# Patient Record
Sex: Male | Born: 1990 | Race: Black or African American | Hispanic: No | Marital: Single | Smoking: Current some day smoker
Health system: Southern US, Community
[De-identification: ages and names within clinical notes are randomized; demographics above are authoritative.]

---

## 2017-08-21 ENCOUNTER — Emergency Department (HOSPITAL_BASED_OUTPATIENT_CLINIC_OR_DEPARTMENT_OTHER)
Admission: EM | Admit: 2017-08-21 | Discharge: 2017-08-21 | Disposition: A | Discharge status: Home / Self Care | Payer: Self-pay | Attending: Emergency Medicine | Admitting: Emergency Medicine

## 2017-08-21 ENCOUNTER — Other Ambulatory Visit: Payer: Self-pay

## 2017-08-21 ENCOUNTER — Emergency Department (HOSPITAL_BASED_OUTPATIENT_CLINIC_OR_DEPARTMENT_OTHER): Payer: Self-pay

## 2017-08-21 ENCOUNTER — Encounter (HOSPITAL_BASED_OUTPATIENT_CLINIC_OR_DEPARTMENT_OTHER): Payer: Self-pay

## 2017-08-21 DIAGNOSIS — F17200 Nicotine dependence, unspecified, uncomplicated: Secondary | ICD-10-CM | POA: Insufficient documentation

## 2017-08-21 DIAGNOSIS — M25572 Pain in left ankle and joints of left foot: Secondary | ICD-10-CM | POA: Insufficient documentation

## 2017-08-21 NOTE — ED Triage Notes (Signed)
C/o twisted left ankle Saturday-NAD-walking with crutches-ace wrap/compression to ankle

## 2017-08-21 NOTE — ED Notes (Signed)
Pt brought own crutches and states understanding of how to use

## 2017-08-21 NOTE — Discharge Instructions (Signed)

## 2017-08-21 NOTE — ED Notes (Signed)
Xray called to report pt also c/o pain to left foot-xray added

## 2017-08-21 NOTE — ED Notes (Signed)
ED Provider at bedside. 

## 2017-08-21 NOTE — ED Provider Notes (Signed)
[facility] EMERGENCY DEPARTMENT Provider Note   CSN: [id number] Arrival date & time: 08/21/17  2036     History   Chief Complaint Chief Complaint  Patient presents with  . Ankle Injury    HPI Artyom Stencel is a 27 y.o. male who presents today for evaluation of left-sided ankle and foot pain.  He reports that on Saturday he was walking when he rolled his ankle.  Since then he has been trying an Ace wrap and crutches and reports that it still hurts to bear weight.  He denies any numbness or tingling.  No weakness.  No other injuries.  HPI  History reviewed. No pertinent past medical history.  There are no active problems to display for this patient.   History reviewed. No pertinent surgical history.      Home Medications    Prior to Admission medications   Not on File    Family History No family history on file.  Social History Social History   Tobacco Use  . Smoking status: Current Every Day Smoker  . Smokeless tobacco: Never Used  Substance Use Topics  . Alcohol use: Not Currently  . Drug use: Yes    Types: Marijuana     Allergies   Patient has no known allergies.   Review of Systems Review of Systems  Constitutional: Negative for chills and fever.  Musculoskeletal:       Swelling and pain in left ankle.  Skin: Positive for color change (Brusing over left foot). Negative for wound.  All other systems reviewed and are negative.    Physical Exam Updated Vital Signs BP 129/89 (BP Location: Right Arm)   Pulse 81   Temp 99.4 F (37.4 C) (Oral)   Resp 16   Ht 5\' 9"  (1.753 m)   Wt 83.9 kg (185 lb)   SpO2 100%   BMI 27.32 kg/m   Physical Exam  Constitutional: He appears well-developed and well-nourished.  HENT:  Head: Normocephalic.  Cardiovascular: Intact distal pulses.  Left sided 2+ DP/PT pulses  Musculoskeletal:  There is generalized pain and tenderness to palpation in the left ankle and proximal foot, it is worse over the  lateral aspect of the ankle.  There is swelling on the lateral aspect of the ankle.  There is no proximal left lower leg tenderness to palpation.  Compartments and left leg are soft and easily compressible.  Neurological:  Sensation intact to left foot  Skin: He is not diaphoretic.  Slight ecchymosis with out wounds over left foot dorsum.   Nursing note and vitals reviewed.    ED Treatments / Results  Labs (all labs ordered are listed, but only abnormal results are displayed) Labs Reviewed - No data to display  EKG None  Radiology Dg Ankle Complete Left  Result Date: 08/21/2017 CLINICAL DATA:  27 year old male with twisting of the left ankle. EXAM: LEFT ANKLE COMPLETE - 3+ VIEW; LEFT FOOT - COMPLETE 3+ VIEW COMPARISON:  None. FINDINGS: There is no acute fracture or dislocation. The bones are well mineralized. No arthritic changes. The ankle mortise is intact. There is mild soft tissue swelling of the ankle. IMPRESSION: No acute fracture or dislocation. Soft tissue swelling of the ankle. Electronically Signed   By: Elgie Collard M.D.   On: 08/21/2017 21:37   Dg Foot Complete Left  Result Date: 08/21/2017 CLINICAL DATA:  28 year old male with twisting of the left ankle. EXAM: LEFT ANKLE COMPLETE - 3+ VIEW; LEFT FOOT - COMPLETE 3+ VIEW  COMPARISON:  None. FINDINGS: There is no acute fracture or dislocation. The bones are well mineralized. No arthritic changes. The ankle mortise is intact. There is mild soft tissue swelling of the ankle. IMPRESSION: No acute fracture or dislocation. Soft tissue swelling of the ankle. Electronically Signed   By: Elgie CollardArash  Radparvar M.D.   On: 08/21/2017 21:37    Procedures Procedures (including critical care time)  Medications Ordered in ED Medications - No data to display   Initial Impression / Assessment and Plan / ED Course  I have reviewed the triage vital signs and the nursing notes.  Pertinent labs & imaging results that were available during my  care of the patient were reviewed by me and considered in my medical decision making (see chart for details).     Seabron SpatesKenard Erxleben Presents with left ankle pain after rolling his ankle consistent with an ankle sprain/strain.  The affected ankle has mild edema and is tender on the lateral aspect.  X-rays were obtained with out acute abnormality. The skin is intact to ankle/foot.  The foot is warm and well perfused with intact sensation.  Motor function is limited secondary to pain.  Patient given instructions for OTC pain medication, and ASO as he has crutches and Ace wrap already.  Patient advised to follow up with PCP if symptoms persist for longer than one week.  Patient was given the option to ask questions, all of which were answered to the best of my ability.  Patient is agreeable for discharge.     Final Clinical Impressions(s) / ED Diagnoses   Final diagnoses:  Acute left ankle pain    ED Discharge Orders    None       Norman ClayHammond, Elizabeth W, PA-C 08/21/17 2329    Arby BarrettePfeiffer, Marcy, MD 08/31/17 2241

## 2019-08-25 ENCOUNTER — Emergency Department (HOSPITAL_BASED_OUTPATIENT_CLINIC_OR_DEPARTMENT_OTHER): Payer: Self-pay

## 2019-08-25 ENCOUNTER — Emergency Department (HOSPITAL_BASED_OUTPATIENT_CLINIC_OR_DEPARTMENT_OTHER)
Admission: EM | Admit: 2019-08-25 | Discharge: 2019-08-25 | Disposition: A | Discharge status: Home / Self Care | Payer: Self-pay | Attending: Emergency Medicine | Admitting: Emergency Medicine

## 2019-08-25 ENCOUNTER — Other Ambulatory Visit: Payer: Self-pay

## 2019-08-25 ENCOUNTER — Encounter (HOSPITAL_BASED_OUTPATIENT_CLINIC_OR_DEPARTMENT_OTHER): Payer: Self-pay | Admitting: Emergency Medicine

## 2019-08-25 DIAGNOSIS — F17210 Nicotine dependence, cigarettes, uncomplicated: Secondary | ICD-10-CM | POA: Insufficient documentation

## 2019-08-25 DIAGNOSIS — R0789 Other chest pain: Secondary | ICD-10-CM | POA: Insufficient documentation

## 2019-08-25 LAB — CBC
HCT: 42.5 % (ref 39.0–52.0)
Hemoglobin: 13.3 g/dL (ref 13.0–17.0)
MCH: 26.2 pg (ref 26.0–34.0)
MCHC: 31.3 g/dL (ref 30.0–36.0)
MCV: 83.8 fL (ref 80.0–100.0)
Platelets: 269 10*3/uL (ref 150–400)
RBC: 5.07 MIL/uL (ref 4.22–5.81)
RDW: 14 % (ref 11.5–15.5)
WBC: 7.2 10*3/uL (ref 4.0–10.5)
nRBC: 0 % (ref 0.0–0.2)

## 2019-08-25 LAB — BASIC METABOLIC PANEL
Anion gap: 11 (ref 5–15)
BUN: 11 mg/dL (ref 6–20)
CO2: 24 mmol/L (ref 22–32)
Calcium: 9.4 mg/dL (ref 8.9–10.3)
Chloride: 104 mmol/L (ref 98–111)
Creatinine, Ser: 0.94 mg/dL (ref 0.61–1.24)
GFR calc Af Amer: >60 mL/min (ref >60)
GFR calc non Af Amer: >60 mL/min (ref >60)
Glucose, Bld: 89 mg/dL (ref 70–99)
Potassium: 3.8 mmol/L (ref 3.5–5.1)
Sodium: 139 mmol/L (ref 135–145)

## 2019-08-25 LAB — TROPONIN I (HIGH SENSITIVITY): Troponin I (High Sensitivity): 2 ng/L (ref <18)

## 2019-08-25 MED ORDER — NAPROXEN 500 MG PO TABS: 500.0000 mg | ORAL_TABLET | Freq: Two times a day (BID) | ORAL | 0 refills | Status: DC

## 2019-08-25 MED ORDER — SODIUM CHLORIDE 0.9% FLUSH
3.0000 mL | Freq: Once | INTRAVENOUS | Status: DC
  Filled 2019-08-25: qty 3

## 2019-08-25 NOTE — ED Provider Notes (Signed)
[facility] EMERGENCY DEPARTMENT Provider Note   CSN: [id number] Arrival date & time: 08/25/19  1919     History Chief Complaint  Patient presents with  . Chest Pain    Lee Estes is a 29 y.o. male.  HPI     This a 29 year old male with no reported past medical history who presents with chest pain.  Patient reports he had some left-sided "pinching" like chest pain while he was working out.  He states he occasionally gets this when working out or lifting heavy boxes at work.  He states he gets some radiation into the left arm.  He has not taken anything for symptoms.  No sweating or shortness of breath.  No lower extremity swelling or history of blood clots.  He is currently pain-free.  No early family history of heart disease.  No known history of hypertension or hyperlipidemia.  He states that he does smoke.  Denies any infectious symptoms including cough.  No recent Covid exposures.  History reviewed. No pertinent past medical history.  There are no problems to display for this patient.   History reviewed. No pertinent surgical history.     No family history on file.  Social History   Tobacco Use  . Smoking status: Current Some Day Smoker    Types: Cigars  . Smokeless tobacco: Never Used  Substance Use Topics  . Alcohol use: Not Currently  . Drug use: Yes    Types: Marijuana    Home Medications Prior to Admission medications   Medication Sig Start Date End Date Taking? Authorizing Provider  naproxen (NAPROSYN) 500 MG tablet Take 1 tablet (500 mg total) by mouth 2 (two) times daily. 08/25/19   Holden Draughon, Mayer Masker, MD    Allergies    Patient has no known allergies.  Review of Systems   Review of Systems  Constitutional: Negative for fever.  Respiratory: Negative for cough and shortness of breath.   Cardiovascular: Positive for chest pain.  Gastrointestinal: Negative for abdominal pain, nausea and vomiting.  Genitourinary: Negative for dysuria.    All other systems reviewed and are negative.   Physical Exam Updated Vital Signs BP (!) 150/106 (BP Location: Right Arm)   Pulse (!) 57   Temp 98.4 F (36.9 C)   Resp 14   Wt 101.7 kg   SpO2 100%   BMI 33.09 kg/m   Physical Exam Vitals and nursing note reviewed.  Constitutional:      Appearance: He is well-developed. He is not ill-appearing.  HENT:     Head: Normocephalic and atraumatic.  Eyes:     Pupils: Pupils are equal, round, and reactive to light.  Cardiovascular:     Rate and Rhythm: Normal rate and regular rhythm.     Heart sounds: Normal heart sounds. No murmur heard.   Pulmonary:     Effort: Pulmonary effort is normal. No respiratory distress.     Breath sounds: Normal breath sounds. No wheezing.  Chest:     Chest wall: Tenderness present. No crepitus.  Abdominal:     General: Bowel sounds are normal.     Palpations: Abdomen is soft.     Tenderness: There is no abdominal tenderness. There is no rebound.  Musculoskeletal:     Cervical back: Neck supple.     Right lower leg: No tenderness. No edema.     Left lower leg: No tenderness. No edema.  Lymphadenopathy:     Cervical: No cervical adenopathy.  Skin:  General: Skin is warm and dry.  Neurological:     Mental Status: He is alert and oriented to person, place, and time.  Psychiatric:        Mood and Affect: Mood normal.     ED Results / Procedures / Treatments   Labs (all labs ordered are listed, but only abnormal results are displayed) Labs Reviewed  BASIC METABOLIC PANEL  CBC  TROPONIN I (HIGH SENSITIVITY)    EKG EKG Interpretation  Date/Time:  Sunday August 25 2019 19:18:47 EDT Ventricular Rate:  64 PR Interval:  188 QRS Duration: 86 QT Interval:  374 QTC Calculation: 385 R Axis:   79 Text Interpretation: Normal sinus rhythm Normal ECG Confirmed by Ianmichael Amescua ([id number]) on 08/25/2019 11:00:23 PM   Radiology DG Chest 2 View  Result Date: 08/25/2019 CLINICAL DATA:  Chest pain,  left arm pain EXAM: CHEST - 2 VIEW COMPARISON:  None. FINDINGS: The heart size and mediastinal contours are within normal limits. Both lungs are clear. The visualized skeletal structures are unremarkable. IMPRESSION: Normal study. Electronically Signed   By: Kevin  Dover M.D.   On: 08/25/2019 19:54    Procedures Procedures (including critical care time)  Medications Ordered in ED Medications  sodium chloride flush (NS) 0.9 % injection 3 mL (has no administration in time range)    ED Course  I have reviewed the triage vital signs and the nursing notes.  Pertinent labs & imaging results that were available during my care of the patient were reviewed by me and considered in my medical decision making (see chart for details).    MDM Rules/Calculators/A&P                           Patient presents with chest pain.  Atypical in nature.  Overall nontoxic-appearing and vital signs notable for blood pressure of 150/106.  He is fairly young and low risk for ACS.  He does have an exertional component but it is unclear whether this is truly exertional or has to do with the musculoskeletal component of exercising as he also has pain with heavy lifting.  Pain is reproducible on exam.  Considerations include but not limited to ACS, musculoskeletal.  Doubt pneumonia, pneumothorax, pericarditis, PE.  He is PERC negative.  EKG independently reviewed by myself and shows no evidence of acute ischemia or arrhythmia.  Chest x-ray reviewed and without evidence of pneumothorax or pneumonia.  Initial troponin is negative.  Given low risk profile and pain-free, do not feel a second troponin is needed at this time.  Will start on naproxen for likely musculoskeletal pain.  Will refer to cardiology for further work-up.  After history, exam, and medical workup I feel the patient has been appropriately medically screened and is safe for discharge home. Pertinent diagnoses were discussed with the patient. Patient was given  return precautions.   Final Clinical Impression(s) / ED Diagnoses Final diagnoses:  Atypical chest pain    Rx / DC Orders ED Discharge Orders         Ordered    naproxen (NAPROSYN) 500 MG tablet  2 times daily     Discontinue  Reprint     06 /27/21 2321           Lakiesha Ralphs, Barbette Hair, MD 08/25/19 2326

## 2019-08-25 NOTE — ED Triage Notes (Signed)
Pt reports central chest pain and reports left arm numbness while at work. States hx of similar symptoms while exercising. No pain at this time.

## 2019-08-25 NOTE — Discharge Instructions (Addendum)
You were seen today for chest pain.  Your work-up is reassuring.  Given that you at times have exertional symptoms, you should follow-up with cardiology.  Take naproxen for pain.

## 2019-08-25 NOTE — ED Notes (Signed)
Dr. Horton ED Provider at bedside. 

## 2019-12-21 ENCOUNTER — Other Ambulatory Visit: Payer: Self-pay

## 2019-12-21 ENCOUNTER — Emergency Department (HOSPITAL_BASED_OUTPATIENT_CLINIC_OR_DEPARTMENT_OTHER)
Admission: EM | Admit: 2019-12-21 | Discharge: 2019-12-21 | Disposition: A | Discharge status: Home / Self Care | Payer: Self-pay | Attending: Emergency Medicine | Admitting: Emergency Medicine

## 2019-12-21 ENCOUNTER — Encounter (HOSPITAL_BASED_OUTPATIENT_CLINIC_OR_DEPARTMENT_OTHER): Payer: Self-pay | Admitting: *Deleted

## 2019-12-21 DIAGNOSIS — Y9389 Activity, other specified: Secondary | ICD-10-CM | POA: Diagnosis not present

## 2019-12-21 DIAGNOSIS — F17290 Nicotine dependence, other tobacco product, uncomplicated: Secondary | ICD-10-CM | POA: Diagnosis not present

## 2019-12-21 DIAGNOSIS — M25512 Pain in left shoulder: Secondary | ICD-10-CM | POA: Insufficient documentation

## 2019-12-21 DIAGNOSIS — Y92410 Unspecified street and highway as the place of occurrence of the external cause: Secondary | ICD-10-CM | POA: Diagnosis not present

## 2019-12-21 DIAGNOSIS — M545 Low back pain, unspecified: Secondary | ICD-10-CM | POA: Insufficient documentation

## 2019-12-21 MED ORDER — IBUPROFEN 800 MG PO TABS: 800.0000 mg | ORAL_TABLET | Freq: Three times a day (TID) | ORAL | 0 refills | Status: AC | PRN

## 2019-12-21 MED ORDER — CYCLOBENZAPRINE HCL 10 MG PO TABS: 10.0000 mg | ORAL_TABLET | Freq: Two times a day (BID) | ORAL | 0 refills | Status: AC | PRN
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
## 2020-03-03 ENCOUNTER — Emergency Department (HOSPITAL_BASED_OUTPATIENT_CLINIC_OR_DEPARTMENT_OTHER): Payer: Self-pay

## 2020-03-03 ENCOUNTER — Emergency Department (HOSPITAL_BASED_OUTPATIENT_CLINIC_OR_DEPARTMENT_OTHER)
Admission: EM | Admit: 2020-03-03 | Discharge: 2020-03-04 | Disposition: A | Discharge status: Home / Self Care | Payer: Self-pay | Attending: Emergency Medicine | Admitting: Emergency Medicine

## 2020-03-03 ENCOUNTER — Encounter (HOSPITAL_BASED_OUTPATIENT_CLINIC_OR_DEPARTMENT_OTHER): Payer: Self-pay

## 2020-03-03 ENCOUNTER — Other Ambulatory Visit: Payer: Self-pay

## 2020-03-03 DIAGNOSIS — W2209XA Striking against other stationary object, initial encounter: Secondary | ICD-10-CM | POA: Insufficient documentation

## 2020-03-03 DIAGNOSIS — F17290 Nicotine dependence, other tobacco product, uncomplicated: Secondary | ICD-10-CM | POA: Insufficient documentation

## 2020-03-03 DIAGNOSIS — S63054A Dislocation of other carpometacarpal joint of right hand, initial encounter: Secondary | ICD-10-CM | POA: Insufficient documentation

## 2020-03-03 MED ORDER — HYDROMORPHONE HCL 1 MG/ML IJ SOLN
1.0000 mg | Freq: Once | INTRAMUSCULAR | Status: AC
  Administered 2020-03-03: 1 mg via SUBCUTANEOUS
  Filled 2020-03-03: qty 1

## 2020-03-03 NOTE — ED Provider Notes (Signed)
[facility] EMERGENCY DEPARTMENT Provider Note   CSN: [id number] Arrival date & time: 03/03/20  1736     History Chief Complaint  Patient presents with   Hand Injury    Lee Estes is a 30 y.o. male with no relevant past medical history presents the ED with right hand injury.  Patient is right-hand dominant.  Admits to anger issues.  States that he punched a telephone pole.  Patient is complaining of pain in the area of fourth and fifth digit.  There is obvious swelling however to his fourth and fifth metacarpals.  He is able to wiggle all fingers.  Denies any numbness or weakness.  He is requesting pain medication.  Opiate nave.  No other injuries or symptoms.   HPI     History reviewed. No pertinent past medical history.  There are no problems to display for this patient.   History reviewed. No pertinent surgical history.     No family history on file.  Social History   Tobacco Use   Smoking status: Current Some Day Smoker    Types: Cigars   Smokeless tobacco: Never Used  Substance Use Topics   Alcohol use: Yes    Comment: occ   Drug use: Yes    Types: Marijuana    Home Medications Prior to Admission medications   Medication Sig Start Date End Date Taking? Authorizing Provider  cyclobenzaprine (FLEXERIL) 10 MG tablet Take 1 tablet (10 mg total) by mouth 2 (two) times daily as needed for muscle spasms. 12/21/19   Pollyann Savoy, MD  ibuprofen (ADVIL) 800 MG tablet Take 1 tablet (800 mg total) by mouth every 8 (eight) hours as needed. 12/21/19   Pollyann Savoy, MD    Allergies    Patient has no known allergies.  Review of Systems   Review of Systems  Musculoskeletal: Positive for arthralgias and joint swelling.  Skin: Positive for wound.  Neurological: Negative for weakness and numbness.    Physical Exam Updated Vital Signs BP (!) 148/78 (BP Location: Right Arm)    Pulse 70    Temp 98.2 F (36.8 C) (Oral)    Resp 19    Ht 5'  10" (1.778 m)    Wt 96.6 kg    SpO2 100%    BMI 30.56 kg/m   Physical Exam Vitals and nursing note reviewed. Exam conducted with a chaperone present.  Constitutional:      Appearance: Normal appearance.  HENT:     Head: Normocephalic and atraumatic.  Eyes:     General: No scleral icterus.    Conjunctiva/sclera: Conjunctivae normal.  Pulmonary:     Effort: Pulmonary effort is normal.  Musculoskeletal:     Comments: Right hand: Deformity noted over base of fourth and fifth metacarpals.  Radial pulse intact.  Wrist ROM fully intact.  Can wiggle all 5 fingers.  Mild superficial nonbleeding abrasion to third MCP joint.  Assessed ulnar, median, and radial nerves, all intact.  Capillary refill less than 2 seconds.  Skin:    General: Skin is dry.     Capillary Refill: Capillary refill takes less than 2 seconds.  Neurological:     Mental Status: He is alert.     GCS: GCS eye subscore is 4. GCS verbal subscore is 5. GCS motor subscore is 6.  Psychiatric:        Mood and Affect: Mood normal.        Behavior: Behavior normal.  Thought Content: Thought content normal.     ED Results / Procedures / Treatments   Labs (all labs ordered are listed, but only abnormal results are displayed) Labs Reviewed - No data to display  EKG None  Radiology DG Hand Complete Right  Result Date: 03/03/2020 CLINICAL DATA:  Status post right fourth and fifth carpometacarpal joint reduction. EXAM: RIGHT HAND - COMPLETE 3+ VIEW COMPARISON:  6:58 p.m. FINDINGS: In the interval since the prior examination, the right fourth and fifth carpometacarpal joints have undergone reduction and application of an external splint. There is minimal dorsal subluxation of the fourth carpometacarpal joint. Near anatomic alignment of the fifth carpometacarpal joint. Tiny ossific fracture fragment is seen dorsal to the hamate likely arising from the hamate itself. No other fracture or dislocation identified. IMPRESSION: Interval  reduction of right fourth and fifth carpometacarpal joints with slight dorsal subluxation of the fourth CMC. Tiny ossific fracture fragment likely arising from the dorsal aspect of the hamate. Electronically Signed   By: Fidela Salisbury MD   On: 03/03/2020 23:45   DG Hand Complete Right  Result Date: 03/03/2020 CLINICAL DATA:  Punched a pole EXAM: RIGHT HAND - COMPLETE 3+ VIEW COMPARISON:  None. FINDINGS: Dorsal dislocation of the bases of the fourth and fifth metacarpals with respect to the carpal bones. Possible small fracture fragments adjacent to the base of the fifth metacarpal. IMPRESSION: Dorsal dislocation of the bases of the fourth and fifth metacarpals with possible punctate fracture fragments adjacent to base of fifth metacarpal Electronically Signed   By: Donavan Foil M.D.   On: 03/03/2020 19:15    Procedures Reduction of dislocation  Date/Time: 03/04/2020 12:29 AM Performed by: Corena Herter, PA-C Authorized by: Corena Herter, PA-C  Consent: Verbal consent obtained. Consent given by: patient Patient understanding: patient states understanding of the procedure being performed Patient consent: the patient's understanding of the procedure matches consent given Procedure consent: procedure consent matches procedure scheduled Relevant documents: relevant documents present and verified Test results: test results available and properly labeled Site marked: the operative site was marked Imaging studies: imaging studies available Patient identity confirmed: verbally with patient Time out: Immediately prior to procedure a "time out" was called to verify the correct patient, procedure, equipment, support staff and site/side marked as required. Local anesthesia used: no  Anesthesia: Local anesthesia used: no  Sedation: Patient sedated: no  Patient tolerance: patient tolerated the procedure well with no immediate complications Comments: No anesthesia.  Instead, patient was treated  with Dilaudid 1 mg subcu prior to reduction attempt.  The procedure went well, no complications.  Patient tolerated without difficulty.  Post procedure x-ray demonstrates interval reduction.    (including critical care time)  Medications Ordered in ED Medications  HYDROmorphone (DILAUDID) injection 1 mg (1 mg Subcutaneous Given 03/03/20 2236)    ED Course  I have reviewed the triage vital signs and the nursing notes.  Pertinent labs & imaging results that were available during my care of the patient were reviewed by me and considered in my medical decision making (see chart for details).  Clinical Course as of 03/04/20 0035  Wed Mar 04, 2020  0025 I spoke with Dr. Grandville Silos who states that he will call patient tomorrow to schedule appointment. [GG]    Clinical Course User Index [GG] Corena Herter, PA-C   MDM Rules/Calculators/A&P  Plain films obtained are personally reviewed and demonstrate dorsal dislocation of the bases of the fourth and fifth metacarpals with possible punctate fracture fragment segments adjacent to the base of the fifth metacarpal.  Patient is functionally and neurovascular intact, closed fist limited due to swelling and discomfort.  However, can flex and extend all fingers with strength intact against resistance.  No other injuries.  Mild abrasion over third MCP joint.  I spoke with on-call hand surgeon, Dr. Janee Morn, who recommended that we provide analgesics and perform reduction procedure.  He reported that we will need to have the ulnar gutter splint immediately available to avoid repeat dislocation of the fourth and fifth MCP bases.  After postreduction films are obtained, he asked that we call him back.  Repeat plain films obtained demonstrate interval reduction of the right fourth and fifth carpometacarpal joints with slight dorsal subluxation of the fourth CMC.  I spoke with Dr. Janee Morn who states that he will call patient  tomorrow to schedule appointment.  SPLINT APPLICATION Date/Time: 12:35 AM Authorized by: Evelena Leyden Consent: Verbal consent obtained. Risks and benefits: risks, benefits and alternatives were discussed Consent given by: patient Splint applied by: orthopedic technician Location details: right hand Splint type: ulnar gutter Supplies used: ulnar gutter Post-procedure: The splinted body part was neurovascularly unchanged following the procedure. Patient tolerance: Patient tolerated the procedure well with no immediate complications.  ED return precautions discussed with patient.  He voices understanding and is agreeable to the plan.  Final Clinical Impression(s) / ED Diagnoses Final diagnoses:  Dislocation of carpometacarpal joint of right hand, initial encounter    Rx / DC Orders ED Discharge Orders    None       Lorelee New, PA-C 03/04/20 0035    Milagros Loll, MD 03/10/20 [phone]

## 2020-03-03 NOTE — ED Provider Notes (Incomplete)
[facility] EMERGENCY DEPARTMENT Provider Note   CSN: [id number] Arrival date & time: 03/03/20  1736     History Chief Complaint  Patient presents with  . Hand Injury    Lee Estes is a 30 y.o. male with no relevant past medical history presents the ED with right hand injury.  Patient is right-hand dominant.  Admits to anger issues.  States that he punched a telephone pole.  Patient is complaining of pain in the area of fourth and fifth digit.  There is obvious swelling however to his fourth and fifth metacarpals.  He is able to wiggle all fingers.  Denies any numbness or weakness.  He is requesting pain medication.  Opiate nave.  No other injuries or symptoms.   HPI     History reviewed. No pertinent past medical history.  There are no problems to display for this patient.   History reviewed. No pertinent surgical history.     No family history on file.  Social History   Tobacco Use  . Smoking status: Current Some Day Smoker    Types: Cigars  . Smokeless tobacco: Never Used  Substance Use Topics  . Alcohol use: Yes    Comment: occ  . Drug use: Yes    Types: Marijuana    Home Medications Prior to Admission medications   Medication Sig Start Date End Date Taking? Authorizing Provider  cyclobenzaprine (FLEXERIL) 10 MG tablet Take 1 tablet (10 mg total) by mouth 2 (two) times daily as needed for muscle spasms. 12/21/19   Pollyann Savoy, MD  ibuprofen (ADVIL) 800 MG tablet Take 1 tablet (800 mg total) by mouth every 8 (eight) hours as needed. 12/21/19   Pollyann Savoy, MD    Allergies    Patient has no known allergies.  Review of Systems   Review of Systems  Musculoskeletal: Positive for arthralgias and joint swelling.  Skin: Positive for wound.  Neurological: Negative for weakness and numbness.    Physical Exam Updated Vital Signs BP (!) 148/78 (BP Location: Right Arm)   Pulse 70   Temp 98.2 F (36.8 C) (Oral)   Resp 19   Ht 5'  10" (1.778 m)   Wt 96.6 kg   SpO2 100%   BMI 30.56 kg/m   Physical Exam Vitals and nursing note reviewed. Exam conducted with a chaperone present.  Constitutional:      Appearance: Normal appearance.  HENT:     Head: Normocephalic and atraumatic.  Eyes:     General: No scleral icterus.    Conjunctiva/sclera: Conjunctivae normal.  Pulmonary:     Effort: Pulmonary effort is normal.  Musculoskeletal:     Comments: Right hand: Deformity noted over base of fourth and fifth metacarpals.  Radial pulse intact.  Wrist ROM fully intact.  Can wiggle all 5 fingers.  Mild superficial nonbleeding abrasion to third MCP joint.  Assessed ulnar, median, and radial nerves, all intact.  Capillary refill less than 2 seconds.  Skin:    General: Skin is dry.     Capillary Refill: Capillary refill takes less than 2 seconds.  Neurological:     Mental Status: He is alert.     GCS: GCS eye subscore is 4. GCS verbal subscore is 5. GCS motor subscore is 6.  Psychiatric:        Mood and Affect: Mood normal.        Behavior: Behavior normal.        Thought Content: Thought content normal.  ED Results / Procedures / Treatments   Labs (all labs ordered are listed, but only abnormal results are displayed) Labs Reviewed - No data to display  EKG None  Radiology DG Hand Complete Right  Result Date: 03/03/2020 CLINICAL DATA:  Status post right fourth and fifth carpometacarpal joint reduction. EXAM: RIGHT HAND - COMPLETE 3+ VIEW COMPARISON:  6:58 p.m. FINDINGS: In the interval since the prior examination, the right fourth and fifth carpometacarpal joints have undergone reduction and application of an external splint. There is minimal dorsal subluxation of the fourth carpometacarpal joint. Near anatomic alignment of the fifth carpometacarpal joint. Tiny ossific fracture fragment is seen dorsal to the hamate likely arising from the hamate itself. No other fracture or dislocation identified. IMPRESSION: Interval  reduction of right fourth and fifth carpometacarpal joints with slight dorsal subluxation of the fourth CMC. Tiny ossific fracture fragment likely arising from the dorsal aspect of the hamate. Electronically Signed   By: Helyn Numbers MD   On: 03/03/2020 23:45   DG Hand Complete Right  Result Date: 03/03/2020 CLINICAL DATA:  Punched a pole EXAM: RIGHT HAND - COMPLETE 3+ VIEW COMPARISON:  None. FINDINGS: Dorsal dislocation of the bases of the fourth and fifth metacarpals with respect to the carpal bones. Possible small fracture fragments adjacent to the base of the fifth metacarpal. IMPRESSION: Dorsal dislocation of the bases of the fourth and fifth metacarpals with possible punctate fracture fragments adjacent to base of fifth metacarpal Electronically Signed   By: Jasmine Pang M.D.   On: 03/03/2020 19:15    Procedures Procedures (including critical care time)  Medications Ordered in ED Medications  HYDROmorphone (DILAUDID) injection 1 mg (1 mg Subcutaneous Given 03/03/20 2236)    ED Course  I have reviewed the triage vital signs and the nursing notes.  Pertinent labs & imaging results that were available during my care of the patient were reviewed by me and considered in my medical decision making (see chart for details).    MDM Rules/Calculators/A&P                          Plain films obtained are personally reviewed and demonstrate dorsal dislocation of the bases of the fourth and fifth metacarpals with possible punctate fracture fragment segments adjacent to the base of the fifth metacarpal.  Patient is functionally and neurovascular intact, closed fist limited due to swelling and discomfort.  However, can flex and extend all fingers with strength intact against resistance.  No other injuries.  Mild abrasion over third MCP joint.  I spoke with on-call hand surgeon, Dr. Janee Morn, who recommended that we provide analgesics and perform reduction procedure.  He reported that we will need  to have the ulnar gutter splint immediately available to avoid repeat dislocation of the fourth and fifth MCP bases.  After postreduction films are obtained, he asked that we call him back.  Repeat plain films obtained demonstrate interval reduction of the right fourth and fifth carpometacarpal joints with slight dorsal subluxation of the fourth CMC.    Final Clinical Impression(s) / ED Diagnoses Final diagnoses:  None    Rx / DC Orders ED Discharge Orders    None

## 2020-03-03 NOTE — ED Triage Notes (Signed)
Pt reports he punched a pole ~5pm-swelling to right hand-NAD-steady gait

## 2020-03-04 NOTE — Discharge Instructions (Addendum)
Please take ibuprofen 600 mg every 6 hours as needed for pain control.  You may also supplement with Tylenol.  You had dislocations involving your fourth and fifth metacarpals.  They have been reduced here in the ED (put back into regular position), however you may need further management.  Please continue to wear the splint.  You will receive a phone call from the office of Dr. Janee Morn shortly.  Return to the ED or seek immediate medical attention should you experience any new or worsening symptoms.  You were given narcotic and or sedative medications while in the emergency department. Do not drive. Do not use machinery or power tools. Do not sign legal documents. Do not drink alcohol. Do not take sleeping pills. Do not supervise children by yourself. Do not participate in activities that require climbing or being in high places.

## 2021-10-08 IMAGING — DX DG HAND COMPLETE 3+V*R*
3 series · 3 of 3 positions shown · non-contrast
Comparison: [DATE] p.m.

CLINICAL DATA: Status post right fourth and fifth carpometacarpal
joint reduction.

EXAM:
RIGHT HAND - COMPLETE 3+ VIEW

[hand ap]
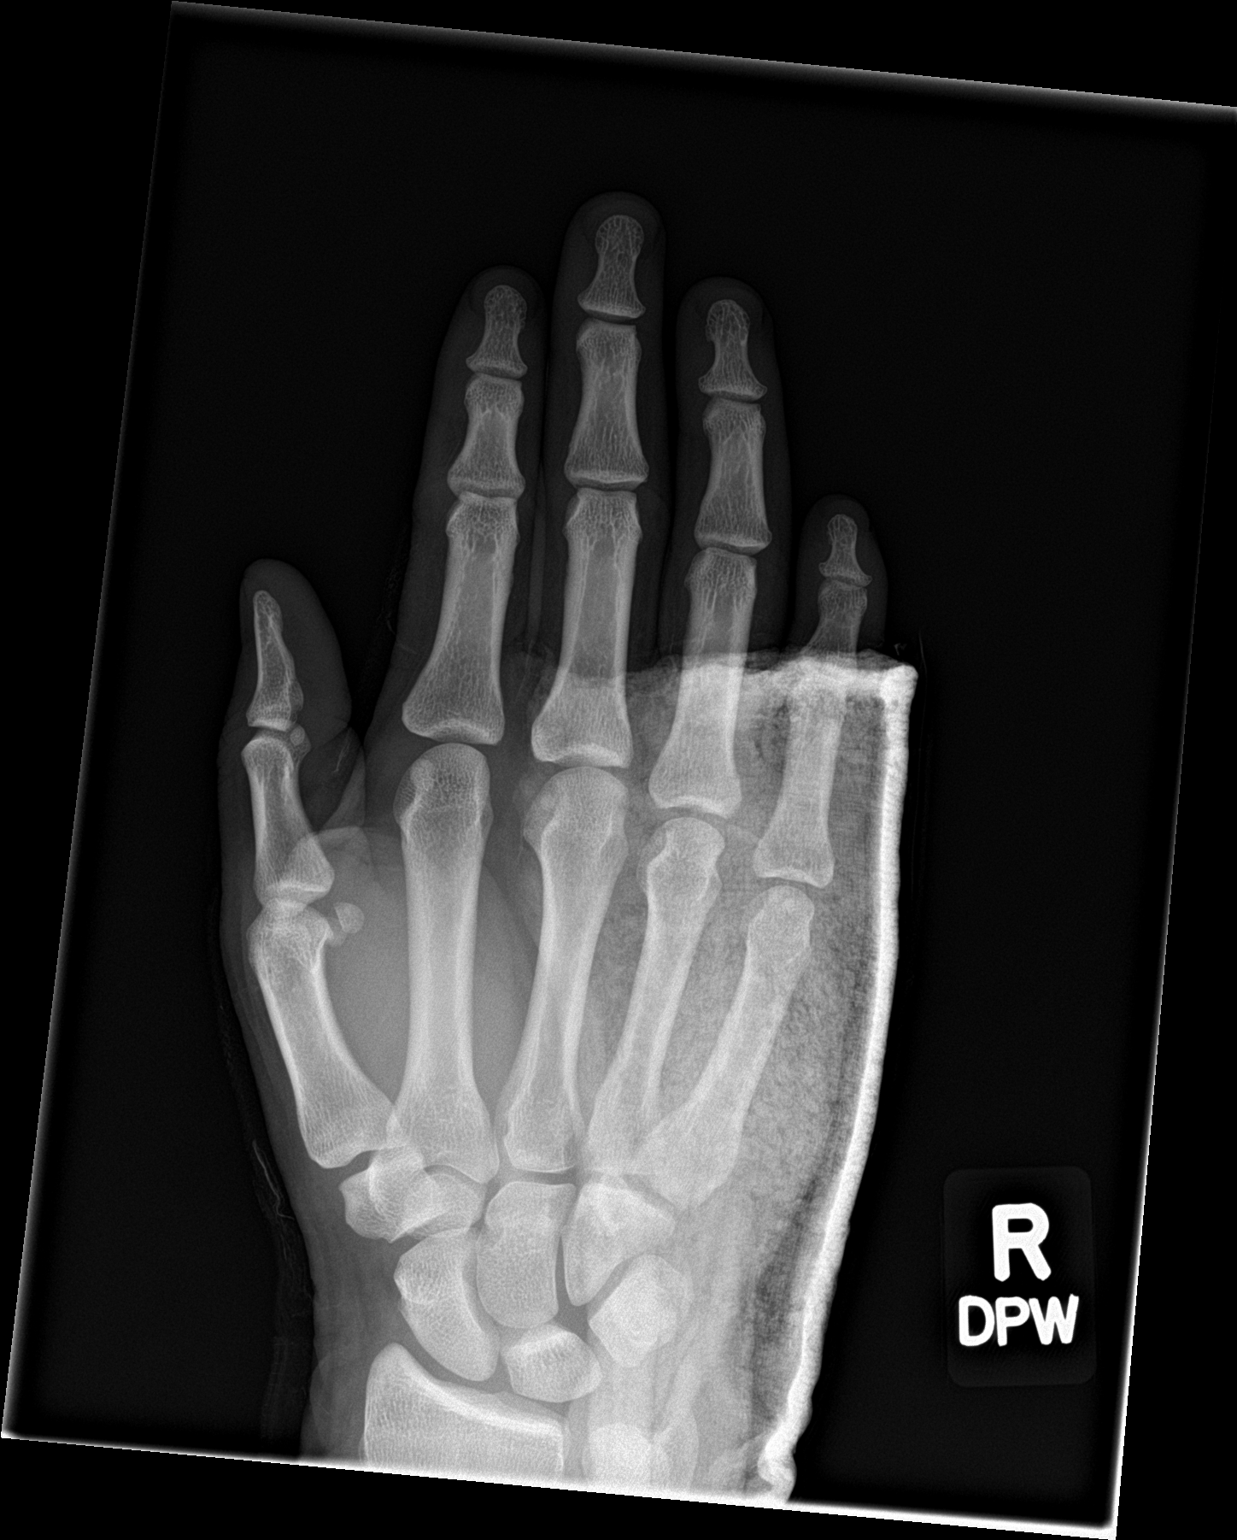

[hand obl]
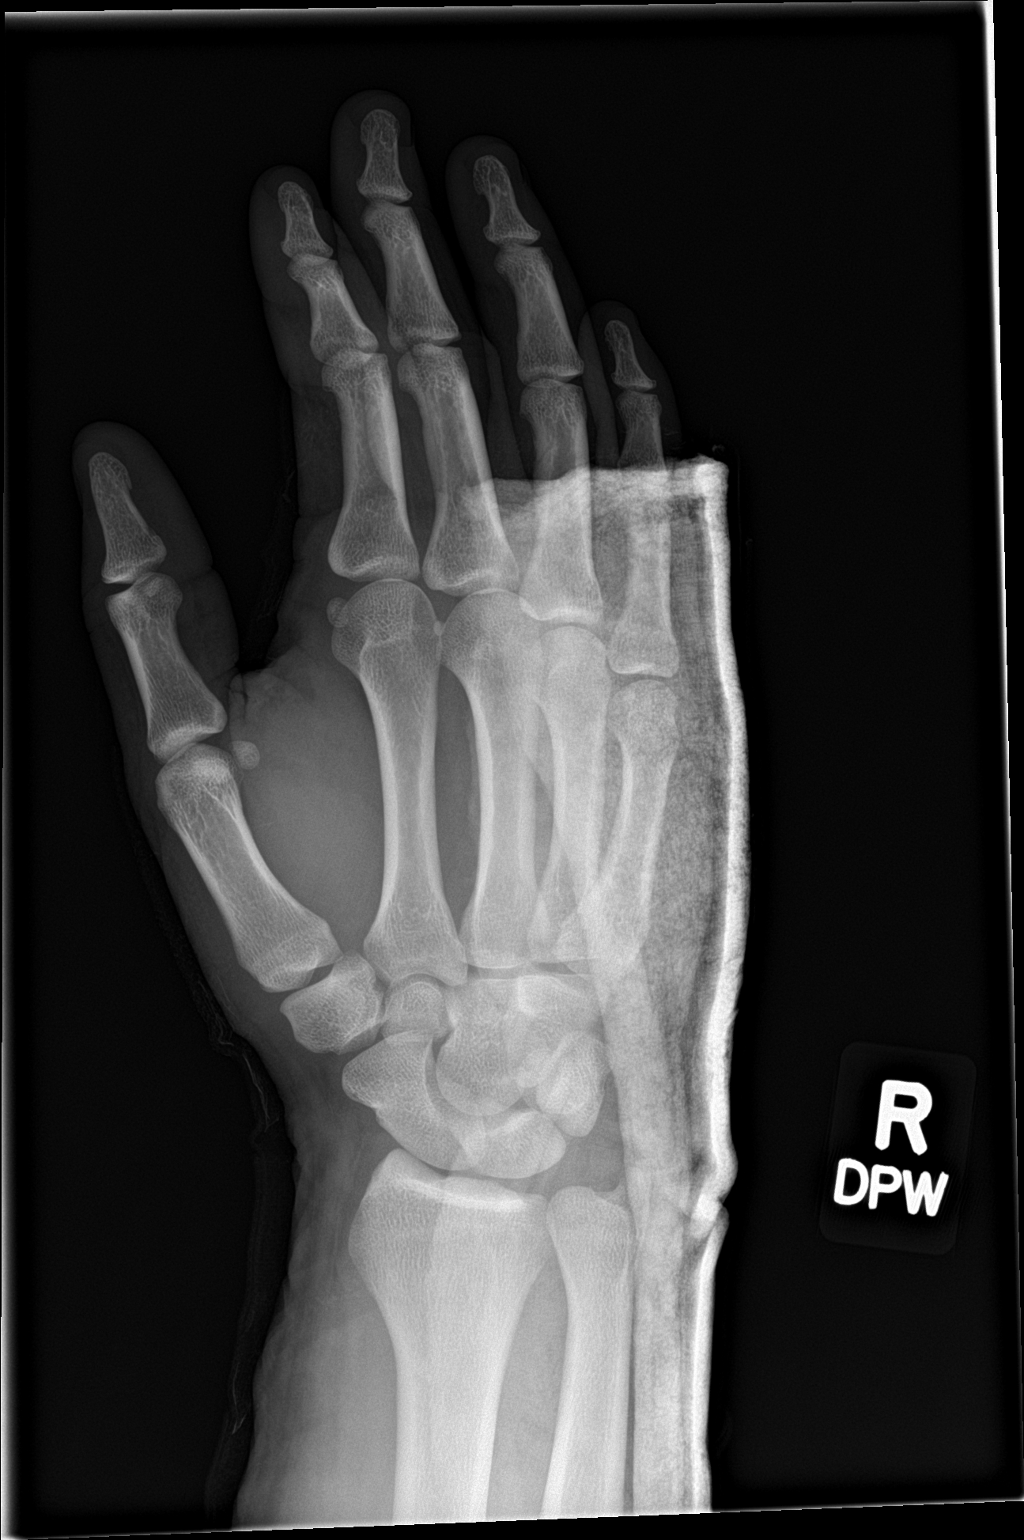

[hand lat]
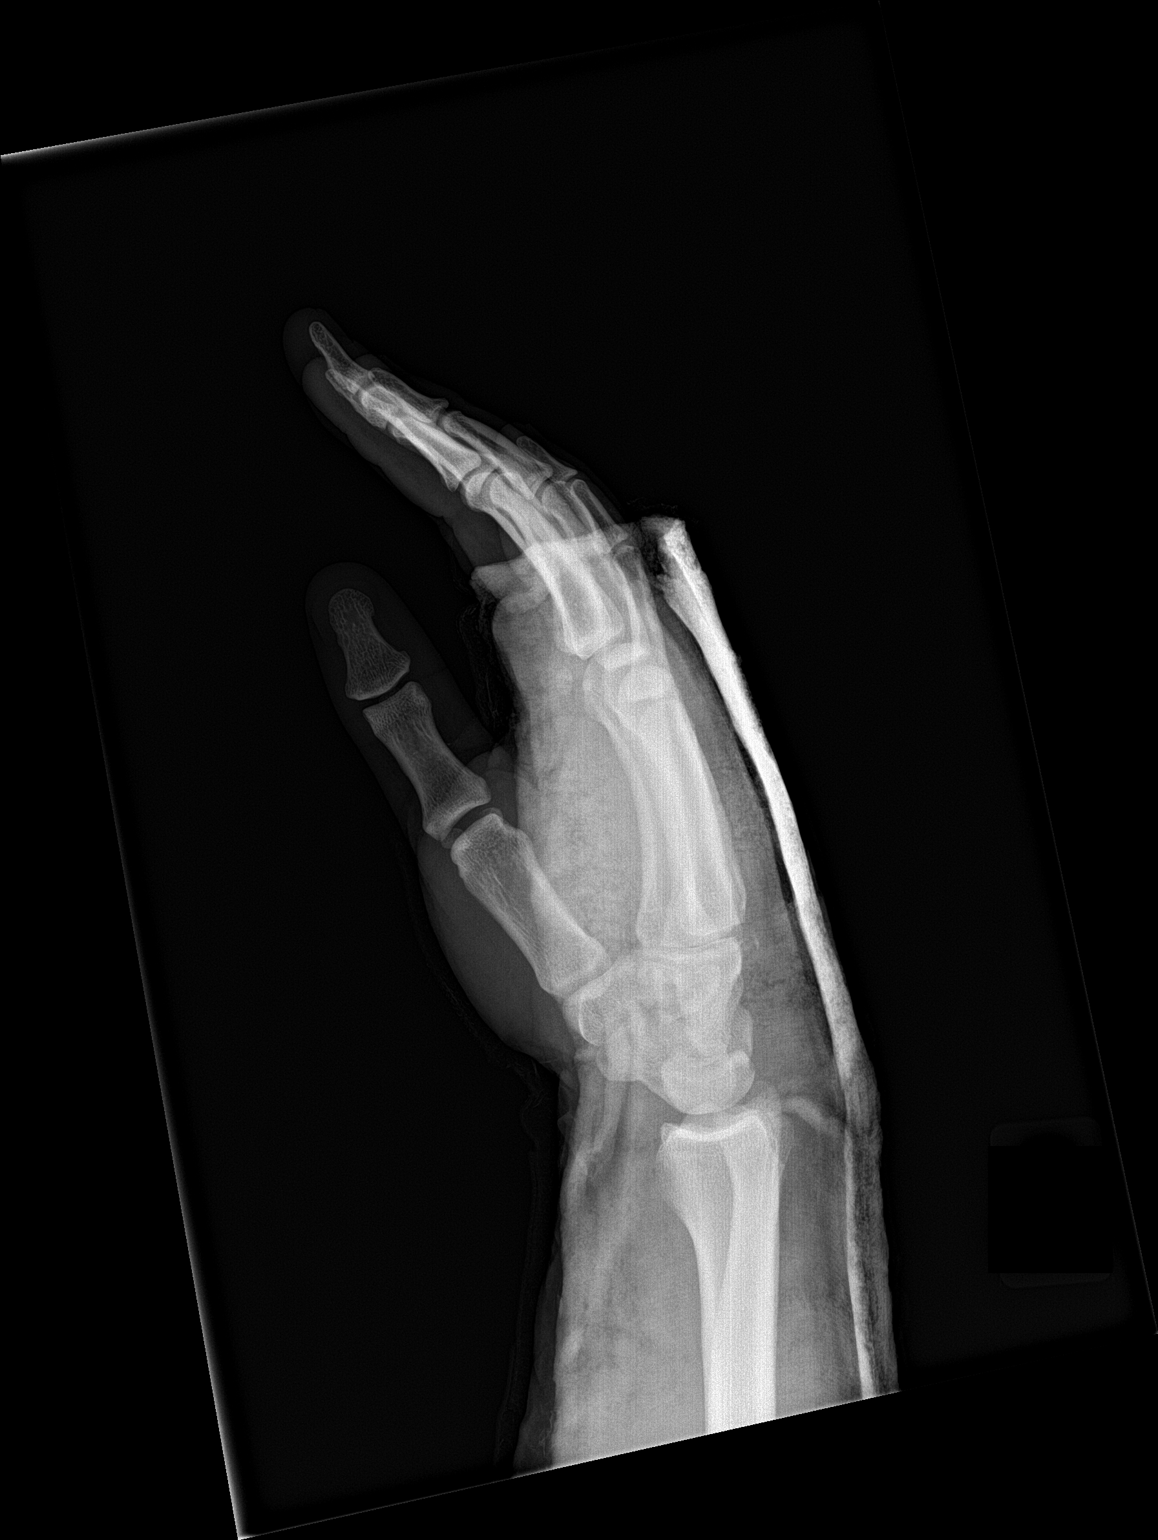

[3 of 3 positions shown; findings below may reference images not displayed]

FINDINGS: In the interval since the prior examination, the right fourth and
fifth carpometacarpal joints have undergone reduction and
application of an external splint. There is minimal dorsal
subluxation of the fourth carpometacarpal joint. Near anatomic
alignment of the fifth carpometacarpal joint. Tiny ossific fracture
fragment is seen dorsal to the hamate likely arising from the hamate
itself. No other fracture or dislocation identified.
IMPRESSION: Interval reduction of right fourth and fifth carpometacarpal joints
with slight dorsal subluxation of the fourth CMC. Tiny ossific
fracture fragment likely arising from the dorsal aspect of the
hamate.
